# Patient Record
Sex: Female | Born: 1965 | Race: White | Hispanic: No | Marital: Married | Smoking: Light tobacco smoker
Health system: Southern US, Community
[De-identification: ages and names within clinical notes are randomized; demographics above are authoritative.]

## PROBLEM LIST (undated history)

## (undated) DIAGNOSIS — M1990 Unspecified osteoarthritis, unspecified site: Secondary | ICD-10-CM

## (undated) DIAGNOSIS — E119 Type 2 diabetes mellitus without complications: Secondary | ICD-10-CM

## (undated) DIAGNOSIS — G4700 Insomnia, unspecified: Secondary | ICD-10-CM

## (undated) DIAGNOSIS — E669 Obesity, unspecified: Secondary | ICD-10-CM

## (undated) DIAGNOSIS — E785 Hyperlipidemia, unspecified: Secondary | ICD-10-CM

## (undated) HISTORY — DX: Type 2 diabetes mellitus without complications: E11.9

## (undated) HISTORY — PX: HAND SURGERY: SHX662

## (undated) HISTORY — PX: TUBAL LIGATION: SHX77

## (undated) HISTORY — PX: APPENDECTOMY: SHX54

---

## 2001-09-12 HISTORY — PX: BACK SURGERY: SHX140

## 2002-09-14 HISTORY — PX: BACK SURGERY: SHX140

## 2004-05-26 ENCOUNTER — Observation Stay: Payer: Self-pay | Admitting: General Surgery

## 2004-08-28 ENCOUNTER — Ambulatory Visit: Payer: Self-pay

## 2004-09-08 ENCOUNTER — Ambulatory Visit: Payer: Self-pay

## 2005-03-10 ENCOUNTER — Ambulatory Visit: Payer: Self-pay

## 2006-04-08 ENCOUNTER — Ambulatory Visit: Payer: Self-pay

## 2007-03-24 DIAGNOSIS — E119 Type 2 diabetes mellitus without complications: Secondary | ICD-10-CM

## 2007-03-24 HISTORY — DX: Type 2 diabetes mellitus without complications: E11.9

## 2007-05-04 ENCOUNTER — Ambulatory Visit: Payer: Self-pay

## 2008-05-10 ENCOUNTER — Ambulatory Visit: Payer: Self-pay

## 2009-05-22 ENCOUNTER — Ambulatory Visit: Payer: Self-pay

## 2010-06-25 ENCOUNTER — Ambulatory Visit: Payer: Self-pay

## 2011-08-27 ENCOUNTER — Ambulatory Visit: Admit: 2011-08-27 | Disposition: A | Discharge status: Home / Self Care | Payer: Self-pay | Admitting: Internal Medicine

## 2013-08-03 ENCOUNTER — Encounter: Payer: Self-pay | Admitting: *Deleted

## 2013-08-30 ENCOUNTER — Ambulatory Visit: Payer: Self-pay | Admitting: General Surgery

## 2013-08-31 ENCOUNTER — Encounter: Payer: Self-pay | Admitting: *Deleted

## 2014-08-08 ENCOUNTER — Encounter: Payer: Self-pay | Admitting: *Deleted

## 2014-08-16 ENCOUNTER — Encounter: Payer: Self-pay | Admitting: General Surgery

## 2014-08-21 ENCOUNTER — Encounter: Payer: Self-pay | Admitting: General Surgery

## 2014-08-21 ENCOUNTER — Ambulatory Visit (INDEPENDENT_AMBULATORY_CARE_PROVIDER_SITE_OTHER): Payer: 59 | Admitting: General Surgery

## 2014-08-21 VITALS — BP 128/74 | HR 78 | Resp 12 | Ht 61.0 in | Wt 201.0 lb

## 2014-08-21 DIAGNOSIS — Z1211 Encounter for screening for malignant neoplasm of colon: Secondary | ICD-10-CM

## 2014-08-21 NOTE — Progress Notes (Signed)
Patient ID: Heather Valenzuela, female   DOB: Jan 13, 1966, 49 y.o.   MRN: [id number]  Chief Complaint  Patient presents with  . Other    screening colonoscopy    HPI Heather Valenzuela is a 49 y.o. femalehere today for a evaluation of a screening colonoscopy. Patient states no GI problems at this time. The patient reports she's lost significant amount of weight since started on oral hypoglycemic agents. She is aware of the increased/accelerated risk cardiovascular disease with ongoing smoking. HPI  Past Medical History  Diagnosis Date  . Diabetes mellitus without complication 9480    Type 2    Past Surgical History  Procedure Laterality Date  . Tubal ligation    . Appendectomy    . Hand surgery    . Back surgery      No family history on file.  Social History History  Substance Use Topics  . Smoking status: Current Every Day Smoker -- 1.00 packs/day    Types: Cigarettes  . Smokeless tobacco: Not on file  . Alcohol Use: 1.2 - 1.8 oz/week    2-3 Standard drinks or equivalent per week    No Known Allergies  Current Outpatient Prescriptions  Medication Sig Dispense Refill  . atorvastatin (LIPITOR) 40 MG tablet     . DULoxetine (CYMBALTA) 30 MG capsule     . INVOKANA 300 MG TABS tablet     . metFORMIN (GLUCOPHAGE-XR) 500 MG 24 hr tablet      No current facility-administered medications for this visit.    Review of Systems Review of Systems  Constitutional: Negative.   Respiratory: Negative.   Cardiovascular: Negative.   Gastrointestinal: Negative.     Blood pressure 128/74, pulse 78, resp. rate 12, height 5\' 1"  (1.549 m), weight 201 lb (91.173 kg).  Physical Exam Physical Exam  Constitutional: She is oriented to person, place, and time. She appears well-developed and well-nourished.  Eyes: Conjunctivae are normal. No scleral icterus.  Neck: Neck supple.  Cardiovascular: Normal rate and regular rhythm.   Murmur heard.  Systolic murmur is present with a grade of 2/6   Pulmonary/Chest: Effort normal and breath sounds normal.  Lymphadenopathy:    She has no cervical adenopathy.  Neurological: She is alert and oriented to person, place, and time.  Skin: Skin is warm and dry.      Assessment    Candidate for screening colonoscopy.    Plan    Colonoscopy with possible biopsy/polypectomy prn: Information regarding the procedure, including its potential risks and complications (including but not limited to perforation of the bowel, which may require emergency surgery to repair, and bleeding) was verbally given to the patient. Educational information regarding lower instestinal endoscopy was given to the patient. Written instructions for how to complete the bowel prep using Miralax were provided. The importance of drinking ample fluids to avoid dehydration as a result of the prep emphasized.  The patient is going to encourage her husband have a procedure completed at the same date. She would like to have her 70 year old daughter act as unified driver, and the daughter is off for con Friday. We'll see if we can't get a time in the future to complete this on the day of patient preference.      PCP:  Kandice Robinsons, Charanjit Ref. Dr. Wynona Luna, Forest Gleason 08/22/2014, 7:08 AM

## 2014-08-21 NOTE — Patient Instructions (Signed)
Colonoscopy  A colonoscopy is an exam to look at the entire large intestine (colon). This exam can help find problems such as tumors, polyps, inflammation, and areas of bleeding. The exam takes about 1 hour.   LET YOUR HEALTH CARE PROVIDER KNOW ABOUT:   · Any allergies you have.  · All medicines you are taking, including vitamins, herbs, eye drops, creams, and over-the-counter medicines.  · Previous problems you or members of your family have had with the use of anesthetics.  · Any blood disorders you have.  · Previous surgeries you have had.  · Medical conditions you have.  RISKS AND COMPLICATIONS   Generally, this is a safe procedure. However, as with any procedure, complications can occur. Possible complications include:  · Bleeding.  · Tearing or rupture of the colon wall.  · Reaction to medicines given during the exam.  · Infection (rare).  BEFORE THE PROCEDURE   · Ask your health care provider about changing or stopping your regular medicines.  · You may be prescribed an oral bowel prep. This involves drinking a large amount of medicated liquid, starting the day before your procedure. The liquid will cause you to have multiple loose stools until your stool is almost clear or light green. This cleans out your colon in preparation for the procedure.  · Do not eat or drink anything else once you have started the bowel prep, unless your health care provider tells you it is safe to do so.  · Arrange for someone to drive you home after the procedure.  PROCEDURE   · You will be given medicine to help you relax (sedative).  · You will lie on your side with your knees bent.  · A long, flexible tube with a light and camera on the end (colonoscope) will be inserted through the rectum and into the colon. The camera sends video back to a computer screen as it moves through the colon. The colonoscope also releases carbon dioxide gas to inflate the colon. This helps your health care provider see the area better.  · During  the exam, your health care provider may take a small tissue sample (biopsy) to be examined under a microscope if any abnormalities are found.  · The exam is finished when the entire colon has been viewed.  AFTER THE PROCEDURE   · Do not drive for 24 hours after the exam.  · You may have a small amount of blood in your stool.  · You may pass moderate amounts of gas and have mild abdominal cramping or bloating. This is caused by the gas used to inflate your colon during the exam.  · Ask when your test results will be ready and how you will get your results. Make sure you get your test results.  Document Released: 03/06/2000 Document Revised: 12/28/2012 Document Reviewed: 11/14/2012  ExitCare® Patient Information ©2015 [employer]. This information is not intended to replace advice given to you by your health care provider. Make sure you discuss any questions you have with your health care provider.

## 2014-08-22 DIAGNOSIS — Z1211 Encounter for screening for malignant neoplasm of colon: Secondary | ICD-10-CM | POA: Insufficient documentation

## 2014-09-05 ENCOUNTER — Telehealth: Payer: Self-pay | Admitting: *Deleted

## 2014-09-05 MED ORDER — POLYETHYLENE GLYCOL 3350 17 GM/SCOOP PO POWD: ORAL | Status: DC

## 2014-09-05 NOTE — Telephone Encounter (Signed)
Message left on home and work numbers for patient to call the office.  Patient has been scheduled for a colonoscopy at [facility] on 11-02-14.  Miralax prescription has been sent in to patient's pharmacy.

## 2014-09-10 ENCOUNTER — Encounter: Payer: Self-pay | Admitting: General Surgery

## 2014-09-10 ENCOUNTER — Telehealth: Payer: Self-pay | Admitting: *Deleted

## 2014-09-10 NOTE — Telephone Encounter (Signed)
Patient called back and was notified accordingly.   This patient was instructed to call back if she has further questions.

## 2014-10-30 ENCOUNTER — Other Ambulatory Visit: Payer: Self-pay | Admitting: General Surgery

## 2014-10-30 DIAGNOSIS — Z1211 Encounter for screening for malignant neoplasm of colon: Secondary | ICD-10-CM

## 2014-10-30 NOTE — H&P (Signed)
Heather Valenzuela is an 49 y.o. female.   Chief Complaint: Screening colonoscopy. HPI: Healthy 49 year old woman for screening colonoscopy. No GI symptoms.  No change in general health or medications since May 2016 office exam.  Past Medical History  Diagnosis Date  . Diabetes mellitus without complication 0045    Type 2    Past Surgical History  Procedure Laterality Date  . Tubal ligation    . Appendectomy    . Hand surgery    . Back surgery      No family history on file. Social History:  reports that she has been smoking Cigarettes.  She has been smoking about 1.00 pack per day. She does not have any smokeless tobacco history on file. She reports that she drinks about 1.2 - 1.8 oz of alcohol per week. She reports that she does not use illicit drugs.  Allergies: No Known Allergies   (Not in a hospital admission)  No results found for this or any previous visit (from the past 48 hour(s)). No results found.  Review of Systems  All other systems reviewed and are negative.   There were no vitals taken for this visit. Physical Exam  Constitutional: She appears well-developed and well-nourished.  HENT:  Head: Normocephalic.  Neck: Neck supple.  Cardiovascular: Normal rate and regular rhythm.   Respiratory: Effort normal and breath sounds normal.  GI: Soft.     Assessment/Plan Candidate for screening colonoscopy.  Robert Bellow 10/30/2014, 4:21 PM

## 2014-11-01 ENCOUNTER — Encounter: Payer: Self-pay | Admitting: *Deleted

## 2014-11-01 ENCOUNTER — Telehealth: Payer: Self-pay | Admitting: *Deleted

## 2014-11-01 NOTE — Telephone Encounter (Signed)
Message left on home and work numbers for patient to call the office.

## 2014-11-02 ENCOUNTER — Ambulatory Visit
Admission: RE | Admit: 2014-11-02 | Discharge: 2014-11-02 | Disposition: A | Discharge status: Home / Self Care | Payer: 59 | Source: Ambulatory Visit | Attending: General Surgery | Admitting: General Surgery

## 2014-11-02 ENCOUNTER — Encounter
Admission: RE | Disposition: A | Discharge status: Home / Self Care | Payer: Self-pay | Source: Ambulatory Visit | Attending: General Surgery

## 2014-11-02 ENCOUNTER — Encounter: Payer: Self-pay | Admitting: *Deleted

## 2014-11-02 ENCOUNTER — Ambulatory Visit: Payer: 59 | Admitting: Certified Registered Nurse Anesthetist

## 2014-11-02 DIAGNOSIS — Z9049 Acquired absence of other specified parts of digestive tract: Secondary | ICD-10-CM | POA: Diagnosis not present

## 2014-11-02 DIAGNOSIS — Z9889 Other specified postprocedural states: Secondary | ICD-10-CM | POA: Insufficient documentation

## 2014-11-02 DIAGNOSIS — Z79899 Other long term (current) drug therapy: Secondary | ICD-10-CM | POA: Insufficient documentation

## 2014-11-02 DIAGNOSIS — Z1211 Encounter for screening for malignant neoplasm of colon: Secondary | ICD-10-CM | POA: Diagnosis not present

## 2014-11-02 DIAGNOSIS — E119 Type 2 diabetes mellitus without complications: Secondary | ICD-10-CM | POA: Insufficient documentation

## 2014-11-02 DIAGNOSIS — K529 Noninfective gastroenteritis and colitis, unspecified: Secondary | ICD-10-CM | POA: Insufficient documentation

## 2014-11-02 DIAGNOSIS — Z9851 Tubal ligation status: Secondary | ICD-10-CM | POA: Diagnosis not present

## 2014-11-02 DIAGNOSIS — F17210 Nicotine dependence, cigarettes, uncomplicated: Secondary | ICD-10-CM | POA: Diagnosis not present

## 2014-11-02 HISTORY — PX: COLONOSCOPY: SHX5424

## 2014-11-02 LAB — GLUCOSE, CAPILLARY: GLUCOSE-CAPILLARY: 110 mg/dL — AB (ref 65–99)

## 2014-11-02 SURGERY — COLONOSCOPY
Anesthesia: General

## 2014-11-02 MED ORDER — LIDOCAINE HCL (CARDIAC) 20 MG/ML IV SOLN
INTRAVENOUS | Status: DC | PRN
  Administered 2014-11-02: 100 mg via INTRAVENOUS

## 2014-11-02 MED ORDER — PROPOFOL INFUSION 10 MG/ML OPTIME
INTRAVENOUS | Status: DC | PRN
  Administered 2014-11-02: 160 ug/kg/min via INTRAVENOUS

## 2014-11-02 MED ORDER — PROPOFOL 10 MG/ML IV BOLUS
INTRAVENOUS | Status: DC | PRN
  Administered 2014-11-02: 20 mg via INTRAVENOUS
  Administered 2014-11-02: 60 mg via INTRAVENOUS

## 2014-11-02 MED ORDER — SODIUM CHLORIDE 0.9 % IV SOLN
INTRAVENOUS | Status: DC
  Administered 2014-11-02: 1000 mL via INTRAVENOUS

## 2014-11-02 NOTE — Transfer of Care (Signed)
Immediate Anesthesia Transfer of Care Note  Patient: Heather Valenzuela  Procedure(s) Performed: Procedure(s): COLONOSCOPY (N/A)  Patient Location: PACU  Anesthesia Type:General  Level of Consciousness: awake, alert  and oriented  Airway & Oxygen Therapy: Patient Spontanous Breathing and Patient connected to nasal cannula oxygen  Post-op Assessment: Report given to RN and Post -op Vital signs reviewed and stable  Post vital signs: Reviewed and stable  Last Vitals:  Filed Vitals:   11/02/14 0840  BP: 131/72  Pulse: 67  Temp: 36.2 C  Resp: 0   Resp 18 Complications: No apparent anesthesia complications

## 2014-11-02 NOTE — H&P (Signed)
Heather Valenzuela is an 49 y.o. female.   Chief Complaint: Screening colonoscopy HPI: See previous history.   Past Medical History  Diagnosis Date  . Diabetes mellitus without complication 0160    Type 2    Past Surgical History  Procedure Laterality Date  . Tubal ligation    . Appendectomy    . Hand surgery    . Back surgery      History reviewed. No pertinent family history. Social History:  reports that she has been smoking Cigarettes.  She has been smoking about 1.00 pack per day. She does not have any smokeless tobacco history on file. She reports that she drinks about 1.2 - 1.8 oz of alcohol per week. She reports that she does not use illicit drugs.  Allergies: No Known Allergies  Medications Prior to Admission  Medication Sig Dispense Refill  . atorvastatin (LIPITOR) 40 MG tablet     . DULoxetine (CYMBALTA) 30 MG capsule     . INVOKANA 300 MG TABS tablet     . metFORMIN (GLUCOPHAGE-XR) 500 MG 24 hr tablet     . polyethylene glycol powder (GLYCOLAX/MIRALAX) powder 255 grams one bottle for colonoscopy prep 255 g 0    Results for orders placed or performed during the hospital encounter of 11/02/14 (from the past 48 hour(s))  Glucose, capillary     Status: Abnormal   Collection Time: 11/02/14  7:42 AM  Result Value Ref Range   Glucose-Capillary 110 (H) 65 - 99 mg/dL   No results found.  Review of Systems  All other systems reviewed and are negative.   Blood pressure 117/78, pulse 91, temperature 98.5 F (36.9 C), temperature source Tympanic, resp. rate 20, height 5\' 1"  (1.549 m), weight 200 lb (90.719 kg), SpO2 100 %. Physical Exam  Constitutional: She appears well-developed and well-nourished.  HENT:  Head: Normocephalic.  Eyes: Conjunctivae are normal.  Neck: Neck supple.  Cardiovascular: Normal rate and regular rhythm.   Respiratory: Effort normal and breath sounds normal.     Assessment/Plan Screening colonoscopy.  Robert Bellow 11/02/2014, 7:47  AM

## 2014-11-02 NOTE — Anesthesia Procedure Notes (Signed)
Performed by: Normajean Nash Pre-anesthesia Checklist: Patient identified, Emergency Drugs available, Suction available, Patient being monitored and Timeout performed Patient Re-evaluated:Patient Re-evaluated prior to inductionOxygen Delivery Method: Nasal cannula Intubation Type: IV induction       

## 2014-11-02 NOTE — Anesthesia Preprocedure Evaluation (Signed)
Anesthesia Evaluation  Patient identified by MRN, date of birth, ID band Patient awake    Reviewed: Allergy & Precautions, H&P , NPO status , Patient's Chart, lab work & pertinent test results, reviewed documented beta blocker date and time   History of Anesthesia Complications Negative for: history of anesthetic complications  Airway Mallampati: I  TM Distance: >3 FB Neck ROM: full    Dental no notable dental hx. (+) Teeth Intact   Pulmonary neg shortness of breath, neg sleep apnea, neg COPDneg recent URI, Current Smoker,  breath sounds clear to auscultation  Pulmonary exam normal       Cardiovascular Exercise Tolerance: Good negative cardio ROS Normal cardiovascular examRhythm:regular Rate:Normal     Neuro/Psych negative neurological ROS  negative psych ROS   GI/Hepatic negative GI ROS, Neg liver ROS,   Endo/Other  diabetes, Oral Hypoglycemic Agents  Renal/GU negative Renal ROS  negative genitourinary   Musculoskeletal   Abdominal   Peds  Hematology negative hematology ROS (+)   Anesthesia Other Findings Past Medical History:   Diabetes mellitus without complication          8127           Comment:Type 2   Reproductive/Obstetrics negative OB ROS                             Anesthesia Physical Anesthesia Plan  ASA: II  Anesthesia Plan: General   Post-op Pain Management:    Induction:   Airway Management Planned:   Additional Equipment:   Intra-op Plan:   Post-operative Plan:   Informed Consent: I have reviewed the patients History and Physical, chart, labs and discussed the procedure including the risks, benefits and alternatives for the proposed anesthesia with the patient or authorized representative who has indicated his/her understanding and acceptance.   Dental Advisory Given  Plan Discussed with: Anesthesiologist, CRNA and Surgeon  Anesthesia Plan Comments:          Anesthesia Quick Evaluation

## 2014-11-02 NOTE — Op Note (Signed)
[facility] Patient Name: Heather Valenzuela Procedure Date: 11/02/2014 7:18 AM MRN: [id number] Account #: [id number] Date of Birth: 02-Sep-1965 Admit Type: Outpatient Age: 49 Room: [facility] ENDO ROOM 1 Gender: Female Note Status: Finalized Procedure:         Colonoscopy Indications:       Screening for colorectal malignant neoplasm Providers:         Robert Bellow, MD Referring MD:      Shirline Frees (Referring MD) Medicines:         Monitored Anesthesia Care Complications:     No immediate complications. Procedure:         Pre-Anesthesia Assessment:                    - Prior to the procedure, a History and Physical was                     performed, and patient medications, allergies and                     sensitivities were reviewed. The patient's tolerance of                     previous anesthesia was reviewed.                    - The risks and benefits of the procedure and the sedation                     options and risks were discussed with the patient. All                     questions were answered and informed consent was obtained.                    After obtaining informed consent, the colonoscope was                     passed under direct vision. Throughout the procedure, the                     patient's blood pressure, pulse, and oxygen saturations                     were monitored continuously. The Colonoscope was                     introduced through the anus and advanced to the the cecum,                     identified by the appendiceal orifice, ileocecal valve and                     palpation. The colonoscopy was performed without                     difficulty. The patient tolerated the procedure well. The                     quality of the bowel preparation was excellent. Findings:      A diffuse pseudomembrane was found in the descending colon, at the       splenic flexure and in the transverse colon. This was  biopsied with a       cold forceps for  histology.      The retroflexed view of the distal rectum and anal verge was normal and       showed no anal or rectal abnormalities. Impression:        - Pseudomembranous enterocolitis.                    - The distal rectum and anal verge are normal on                     retroflexion view. Recommendation:    - Telephone endoscopist for pathology results in 1 week. Procedure Code(s): --- Professional ---                    [phone], Colonoscopy, flexible; with biopsy, single or                     multiple Diagnosis Code(s): --- Professional ---                    Z12.11, Encounter for screening for malignant neoplasm of                     colon CPT copyright 2014 American Medical Association. All rights reserved. The codes documented in this report are preliminary and upon coder review may  be revised to meet current compliance requirements. Robert Bellow, MD 11/02/2014 8:38:06 AM This report has been signed electronically. Number of Addenda: 0 Note Initiated On: 11/02/2014 7:18 AM Scope Withdrawal Time: 0 hours 13 minutes 46 seconds  Total Procedure Duration: 0 hours 20 minutes 40 seconds       [facility]

## 2014-11-04 NOTE — Anesthesia Postprocedure Evaluation (Signed)
  Anesthesia Post-op Note  Patient: Heather Valenzuela  Procedure(s) Performed: Procedure(s): COLONOSCOPY (N/A)  Anesthesia type:General  Patient location: PACU  Post pain: Pain level controlled  Post assessment: Post-op Vital signs reviewed, Patient's Cardiovascular Status Stable, Respiratory Function Stable, Patent Airway and No signs of Nausea or vomiting  Post vital signs: Reviewed and stable  Last Vitals:  Filed Vitals:   11/02/14 0913  BP: 132/71  Pulse: 60  Temp:   Resp: 13    Level of consciousness: awake, alert  and patient cooperative  Complications: No apparent anesthesia complications

## 2014-11-05 LAB — SURGICAL PATHOLOGY

## 2014-11-06 ENCOUNTER — Telehealth: Payer: Self-pay | Admitting: General Surgery

## 2014-11-06 NOTE — Telephone Encounter (Signed)
The patient was asked to call back for details of her recently completed colonoscopy biopsy.

## 2014-11-07 ENCOUNTER — Telehealth: Payer: Self-pay | Admitting: *Deleted

## 2014-11-07 ENCOUNTER — Other Ambulatory Visit: Payer: Self-pay | Admitting: *Deleted

## 2014-11-07 ENCOUNTER — Other Ambulatory Visit: Payer: Self-pay | Admitting: General Surgery

## 2014-11-07 DIAGNOSIS — K529 Noninfective gastroenteritis and colitis, unspecified: Secondary | ICD-10-CM

## 2014-11-07 NOTE — Telephone Encounter (Signed)
-----  Message from Robert Bellow, MD sent at 11/07/2014  9:39 AM EDT ----- Please notify the patient no cancer or polyps on recent colonoscopy. Some inflammation in the colon (little patches like grains of rice). Would recommend she have a CBC,ESR and CRP to assess.  ----- Message -----    From: [facility]    Sent: 11/05/2014  12:44 PM      To: Robert Bellow, MD

## 2014-11-07 NOTE — Telephone Encounter (Signed)
Notified patient as instructed, patient pleased. Faxed lab form to pt so she could get labs drawn. Discussed follow-up appointments, patient agrees

## 2014-11-07 NOTE — Telephone Encounter (Signed)
Patient is calling you back in regards to the message you left with her yesterday about colonoscopy bx results

## 2014-11-08 LAB — CBC WITH DIFFERENTIAL/PLATELET
BASOS: 0 %
Basophils Absolute: 0 10*3/uL (ref 0.0–0.2)
EOS (ABSOLUTE): 0.2 10*3/uL (ref 0.0–0.4)
Eos: 3 %
HEMATOCRIT: 39.4 % (ref 34.0–46.6)
HEMOGLOBIN: 13.6 g/dL (ref 11.1–15.9)
IMMATURE GRANS (ABS): 0 10*3/uL (ref 0.0–0.1)
Immature Granulocytes: 0 %
Lymphocytes Absolute: 3.3 10*3/uL — ABNORMAL HIGH (ref 0.7–3.1)
Lymphs: 40 %
MCH: 28 pg (ref 26.6–33.0)
MCHC: 34.5 g/dL (ref 31.5–35.7)
MCV: 81 fL (ref 79–97)
Monocytes Absolute: 0.6 10*3/uL (ref 0.1–0.9)
Monocytes: 7 %
NEUTROS PCT: 50 %
Neutrophils Absolute: 4.1 10*3/uL (ref 1.4–7.0)
Platelets: 235 10*3/uL (ref 150–379)
RBC: 4.85 x10E6/uL (ref 3.77–5.28)
RDW: 13.6 % (ref 12.3–15.4)
WBC: 8.2 10*3/uL (ref 3.4–10.8)

## 2014-11-08 LAB — SEDIMENTATION RATE: Sed Rate: 24 mm/hr (ref 0–40)

## 2014-11-08 LAB — HIGH SENSITIVITY CRP: CRP, High Sensitivity: 2.41 mg/L (ref 0.00–3.00)

## 2014-11-11 NOTE — Telephone Encounter (Signed)
Message left with the patient's daughter, Heather Valenzuela as the patient was out of town. All laboratory studies normal.  No further intervention regarding the focal areas of colitis on recent colonoscopy.  Patient is welcome to return call for details if desired.

## 2014-11-19 ENCOUNTER — Encounter: Payer: Self-pay | Admitting: General Surgery

## 2014-11-28 ENCOUNTER — Encounter: Payer: Self-pay | Admitting: General Surgery

## 2016-10-21 ENCOUNTER — Other Ambulatory Visit: Payer: Self-pay | Admitting: Family Medicine

## 2016-10-21 DIAGNOSIS — Z1231 Encounter for screening mammogram for malignant neoplasm of breast: Secondary | ICD-10-CM

## 2016-10-23 ENCOUNTER — Ambulatory Visit
Admission: RE | Admit: 2016-10-23 | Discharge: 2016-10-23 | Disposition: A | Discharge status: Home / Self Care | Payer: 59 | Source: Ambulatory Visit | Attending: Family Medicine | Admitting: Family Medicine

## 2016-10-23 DIAGNOSIS — Z1231 Encounter for screening mammogram for malignant neoplasm of breast: Secondary | ICD-10-CM | POA: Diagnosis not present

## 2016-10-30 ENCOUNTER — Ambulatory Visit
Admission: RE | Admit: 2016-10-30 | Discharge: 2016-10-30 | Disposition: A | Discharge status: Home / Self Care | Payer: 59 | Source: Ambulatory Visit | Attending: Gastroenterology | Admitting: Gastroenterology

## 2016-10-30 ENCOUNTER — Other Ambulatory Visit: Payer: Self-pay | Admitting: Gastroenterology

## 2016-10-30 DIAGNOSIS — R140 Abdominal distension (gaseous): Secondary | ICD-10-CM

## 2016-10-30 DIAGNOSIS — R1084 Generalized abdominal pain: Secondary | ICD-10-CM | POA: Insufficient documentation

## 2016-11-16 ENCOUNTER — Encounter
Admission: RE | Disposition: A | Discharge status: Home / Self Care | Payer: Self-pay | Source: Ambulatory Visit | Attending: Gastroenterology

## 2016-11-16 ENCOUNTER — Ambulatory Visit: Payer: 59 | Admitting: Anesthesiology

## 2016-11-16 ENCOUNTER — Encounter: Payer: Self-pay | Admitting: Anesthesiology

## 2016-11-16 ENCOUNTER — Ambulatory Visit
Admission: RE | Admit: 2016-11-16 | Discharge: 2016-11-16 | Disposition: A | Discharge status: Home / Self Care | Payer: 59 | Source: Ambulatory Visit | Attending: Gastroenterology | Admitting: Gastroenterology

## 2016-11-16 DIAGNOSIS — E119 Type 2 diabetes mellitus without complications: Secondary | ICD-10-CM | POA: Diagnosis not present

## 2016-11-16 DIAGNOSIS — G4700 Insomnia, unspecified: Secondary | ICD-10-CM | POA: Diagnosis not present

## 2016-11-16 DIAGNOSIS — Z6839 Body mass index (BMI) 39.0-39.9, adult: Secondary | ICD-10-CM | POA: Diagnosis not present

## 2016-11-16 DIAGNOSIS — R194 Change in bowel habit: Secondary | ICD-10-CM | POA: Insufficient documentation

## 2016-11-16 DIAGNOSIS — E785 Hyperlipidemia, unspecified: Secondary | ICD-10-CM | POA: Insufficient documentation

## 2016-11-16 DIAGNOSIS — K5730 Diverticulosis of large intestine without perforation or abscess without bleeding: Secondary | ICD-10-CM | POA: Diagnosis not present

## 2016-11-16 DIAGNOSIS — E669 Obesity, unspecified: Secondary | ICD-10-CM | POA: Insufficient documentation

## 2016-11-16 DIAGNOSIS — Z7984 Long term (current) use of oral hypoglycemic drugs: Secondary | ICD-10-CM | POA: Diagnosis not present

## 2016-11-16 DIAGNOSIS — M1990 Unspecified osteoarthritis, unspecified site: Secondary | ICD-10-CM | POA: Insufficient documentation

## 2016-11-16 DIAGNOSIS — D122 Benign neoplasm of ascending colon: Secondary | ICD-10-CM | POA: Diagnosis not present

## 2016-11-16 DIAGNOSIS — Z79899 Other long term (current) drug therapy: Secondary | ICD-10-CM | POA: Diagnosis not present

## 2016-11-16 HISTORY — DX: Insomnia, unspecified: G47.00

## 2016-11-16 HISTORY — DX: Obesity, unspecified: E66.9

## 2016-11-16 HISTORY — DX: Unspecified osteoarthritis, unspecified site: M19.90

## 2016-11-16 HISTORY — DX: Hyperlipidemia, unspecified: E78.5

## 2016-11-16 HISTORY — PX: COLONOSCOPY WITH PROPOFOL: SHX5780

## 2016-11-16 LAB — GLUCOSE, CAPILLARY: Glucose-Capillary: 118 mg/dL — ABNORMAL HIGH (ref 65–99)

## 2016-11-16 SURGERY — COLONOSCOPY WITH PROPOFOL
Anesthesia: General

## 2016-11-16 MED ORDER — PROPOFOL 500 MG/50ML IV EMUL
INTRAVENOUS | Status: DC | PRN
  Administered 2016-11-16: 150 ug/kg/min via INTRAVENOUS

## 2016-11-16 MED ORDER — SODIUM CHLORIDE 0.9 % IV SOLN
INTRAVENOUS | Status: DC
  Administered 2016-11-16: 1000 mL via INTRAVENOUS
  Administered 2016-11-16: 09:00:00 via INTRAVENOUS

## 2016-11-16 MED ORDER — SODIUM CHLORIDE 0.9 % IV SOLN: INTRAVENOUS | Status: DC

## 2016-11-16 MED ORDER — FENTANYL CITRATE (PF) 100 MCG/2ML IJ SOLN: 25.0000 ug | INTRAMUSCULAR | Status: DC | PRN

## 2016-11-16 MED ORDER — PROPOFOL 10 MG/ML IV BOLUS
INTRAVENOUS | Status: DC | PRN
  Administered 2016-11-16: 3 mg via INTRAVENOUS
  Administered 2016-11-16: 60 mg via INTRAVENOUS

## 2016-11-16 MED ORDER — ONDANSETRON HCL 4 MG/2ML IJ SOLN: 4.0000 mg | Freq: Once | INTRAMUSCULAR | Status: DC | PRN

## 2016-11-16 MED ORDER — PROPOFOL 500 MG/50ML IV EMUL
INTRAVENOUS | Status: AC
Start: 2016-11-16 — End: 2016-11-16
  Filled 2016-11-16: qty 50

## 2016-11-16 MED ORDER — PROPOFOL 10 MG/ML IV BOLUS
INTRAVENOUS | Status: AC
  Filled 2016-11-16: qty 20

## 2016-11-16 NOTE — Anesthesia Post-op Follow-up Note (Signed)
Anesthesia QCDR form completed.        

## 2016-11-16 NOTE — Transfer of Care (Signed)
Immediate Anesthesia Transfer of Care Note  Patient: Heather Valenzuela  Procedure(s) Performed: Procedure(s): COLONOSCOPY WITH PROPOFOL (N/A)  Patient Location: PACU  Anesthesia Type:General  Level of Consciousness: awake, alert  and oriented  Airway & Oxygen Therapy: Patient Spontanous Breathing and Patient connected to nasal cannula oxygen  Post-op Assessment: Report given to RN and Post -op Vital signs reviewed and stable  Post vital signs: Reviewed and stable  Last Vitals:  Vitals:   11/16/16 0751  BP: 138/75  Pulse: 84  Resp: 16  Temp: 36.9 C  SpO2: 100%    Last Pain:  Vitals:   11/16/16 0751  TempSrc: Tympanic         Complications: No apparent anesthesia complications

## 2016-11-16 NOTE — H&P (Signed)
Outpatient short stay form Pre-procedure 11/16/2016 8:54 AM Lollie Sails MD  Primary Physician: Mercy Riding NP  Reason for visit:  Colonoscopy  History of present illness:  Patient is a 51 year old female presenting today for colonoscopy in regards to change of bowel habits. Her bowel habits are quite variable. Many times they are loose however she is also had some increased fecal incontinence. She does have a history of episiotomy on the delivery of a single child. This was over 20 years ago. She has tried taking Bentyl without good effect. She tolerated her prep well. She takes no aspirin or blood thinning agents. It is of note the patient does take Cymbalta, metformin, and trulicity.    Current Facility-Administered Medications:  .  0.9 %  sodium chloride infusion, , Intravenous, Continuous, Lollie Sails, MD, Last Rate: 20 mL/hr at 11/16/16 0803, 1,000 mL at 11/16/16 0803 .  0.9 %  sodium chloride infusion, , Intravenous, Continuous, Lollie Sails, MD  Prescriptions Prior to Admission  Medication Sig Dispense Refill Last Dose  . ALPRAZolam (XANAX) 0.25 MG tablet Take 0.25 mg by mouth at bedtime as needed for sleep.   Past Week at Unknown time  . atorvastatin (LIPITOR) 40 MG tablet    Past Week at Unknown time  . DULoxetine (CYMBALTA) 30 MG capsule    11/15/2016 at Unknown time  . empagliflozin (JARDIANCE) 10 MG TABS tablet Take 10 mg by mouth daily before breakfast.   11/15/2016 at Unknown time  . metFORMIN (GLUCOPHAGE-XR) 500 MG 24 hr tablet    11/15/2016 at Unknown time  . Dulaglutide (TRULICITY) 1.5 OY/7.7AJ SOPN Inject 1.5 mg into the skin every 7 (seven) days.   Completed Course at Unknown time  . fluticasone (FLONASE) 50 MCG/ACT nasal spray Place into both nostrils daily.   Completed Course at Unknown time  . INVOKANA 300 MG TABS tablet    Completed Course at Unknown time     No Known Allergies   Past Medical History:  Diagnosis Date  . Diabetes mellitus without  complication ([place]) 2878   Type 2  . Dyslipidemia   . Insomnia   . Obesity   . Osteoarthrosis    unspecified whether generalized or localized, lower leg     Review of systems:      Physical Exam    Heart and lungs: Regular rate and rhythm without rub or gallop, lungs are bilaterally clear.    HEENT: Normocephalic atraumatic eyes are anicteric    Other:     Pertinant exam for procedure: Soft nontender nondistended bowel sounds positive normoactive    Planned proceedures: Colonoscopy and indicated procedures. I have discussed the risks benefits and complications of procedures to include not limited to bleeding, infection, perforation and the risk of sedation and the patient wishes to proceed.    Lollie Sails, MD Gastroenterology 11/16/2016  8:54 AM

## 2016-11-16 NOTE — Anesthesia Preprocedure Evaluation (Signed)
Anesthesia Evaluation  Patient identified by MRN, date of birth, ID band Patient awake    Reviewed: Allergy & Precautions, H&P , NPO status , Patient's Chart, lab work & pertinent test results, reviewed documented beta blocker date and time   History of Anesthesia Complications Negative for: history of anesthetic complications  Airway Mallampati: I  TM Distance: >3 FB Neck ROM: full    Dental no notable dental hx. (+) Teeth Intact   Pulmonary neg shortness of breath, neg sleep apnea, neg COPD, neg recent URI, Current Smoker,    Pulmonary exam normal breath sounds clear to auscultation       Cardiovascular Exercise Tolerance: Good negative cardio ROS Normal cardiovascular exam Rhythm:regular Rate:Normal     Neuro/Psych negative neurological ROS  negative psych ROS   GI/Hepatic negative GI ROS, Neg liver ROS,   Endo/Other  diabetes, Well Controlled, Oral Hypoglycemic Agents  Renal/GU negative Renal ROS  negative genitourinary   Musculoskeletal  (+) Arthritis , Osteoarthritis,    Abdominal   Peds  Hematology negative hematology ROS (+)   Anesthesia Other Findings Past Medical History:   Diabetes mellitus without complication          2725           Comment:Type 2   Reproductive/Obstetrics negative OB ROS                             Anesthesia Physical  Anesthesia Plan  ASA: II  Anesthesia Plan: General   Post-op Pain Management:    Induction:   PONV Risk Score and Plan:   Airway Management Planned:   Additional Equipment:   Intra-op Plan:   Post-operative Plan:   Informed Consent: I have reviewed the patients History and Physical, chart, labs and discussed the procedure including the risks, benefits and alternatives for the proposed anesthesia with the patient or authorized representative who has indicated his/her understanding and acceptance.   Dental Advisory  Given  Plan Discussed with: Anesthesiologist, CRNA and Surgeon  Anesthesia Plan Comments:         Anesthesia Quick Evaluation

## 2016-11-16 NOTE — Anesthesia Postprocedure Evaluation (Signed)
Anesthesia Post Note  Patient: Heather Valenzuela  Procedure(s) Performed: Procedure(s) (LRB): COLONOSCOPY WITH PROPOFOL (N/A)  Patient location during evaluation: PACU Anesthesia Type: General Level of consciousness: awake and alert and oriented Pain management: pain level controlled Vital Signs Assessment: post-procedure vital signs reviewed and stable Respiratory status: spontaneous breathing Cardiovascular status: blood pressure returned to baseline Anesthetic complications: no     Last Vitals:  Vitals:   11/16/16 1007 11/16/16 1017  BP: 127/80 126/78  Pulse: 72 66  Resp: 16 18  Temp:    SpO2: 97% 100%    Last Pain:  Vitals:   11/16/16 0947  TempSrc: Tympanic                 Quanasia Defino

## 2016-11-16 NOTE — Op Note (Signed)
[facility] Patient Name: Heather Valenzuela Procedure Date: 11/16/2016 8:50 AM MRN: [id number] Account #: [id number] Date of Birth: 02/11/1966 Admit Type: Outpatient Age: 51 Room: [facility] ENDO ROOM 3 Gender: Female Note Status: Finalized Procedure:            Colonoscopy Indications:          Follow-up of ulcerative colitis, Change in bowel habits Providers:            Lollie Sails, MD Referring MD:         No Local Md, MD (Referring MD) Medicines:            Monitored Anesthesia Care Complications:        No immediate complications. Procedure:            Pre-Anesthesia Assessment:                       - ASA Grade Assessment: II - A patient with mild                        systemic disease.                       After obtaining informed consent, the colonoscope was                        passed under direct vision. Throughout the procedure,                        the patient's blood pressure, pulse, and oxygen                        saturations were monitored continuously. The Olympus                        PCF-H180AL colonoscope ( S#: [id number] ) was introduced                        through the anus and advanced to the the cecum,                        identified by appendiceal orifice and ileocecal valve.                        The colonoscopy was unusually difficult due to                        significant looping. Successful completion of the                        procedure was aided by changing the patient to a supine                        position, changing the patient to a prone position and                        using manual pressure. The patient tolerated the                        procedure well. The quality of the bowel preparation  was good. Findings:      Multiple small-mouthed diverticula were found in the sigmoid colon,       descending colon, transverse colon and ascending colon.      A 5 mm polyp was  found in the mid ascending colon. The polyp was       sessile. The polyp was removed with a cold snare. Resection and       retrieval were complete.      Biopsies for histology were taken with a cold forceps from the right       colon and left colon for evaluation of microscopic colitis.      The retroflexed view of the distal rectum and anal verge was normal and       showed no anal or rectal abnormalities.      Rectal examination prior to sedation showed weak external sphincter,       normal descent. Impression:           - Diverticulosis in the sigmoid colon, in the                        descending colon, in the transverse colon and in the                        ascending colon.                       - One 5 mm polyp in the mid ascending colon, removed                        with a cold snare. Resected and retrieved.                       - The distal rectum and anal verge are normal on                        retroflexion view.                       - Biopsies were taken with a cold forceps from the                        right colon and left colon for evaluation of                        microscopic colitis. Recommendation:       - Immodium 2 mg po daily, one dose of citrucel daily,                        [place]agle exercises.                       - Return to GI clinic in 4 weeks. Procedure Code(s):    --- Professional ---                       [phone], Colonoscopy, flexible; with removal of tumor(s),                        polyp(s), or other lesion(s) by snare technique  82423, 4, Colonoscopy, flexible; with biopsy, single                        or multiple Diagnosis Code(s):    --- Professional ---                       D12.2, Benign neoplasm of ascending colon                       K51.90, Ulcerative colitis, unspecified, without                        complications                       R19.4, Change in bowel habit                       K57.30, Diverticulosis  of large intestine without                        perforation or abscess without bleeding CPT copyright 2016 American Medical Association. All rights reserved. The codes documented in this report are preliminary and upon coder review may  be revised to meet current compliance requirements. Lollie Sails, MD 11/16/2016 10:02:53 AM This report has been signed electronically. Number of Addenda: 0 Note Initiated On: 11/16/2016 8:50 AM Scope Withdrawal Time: 0 hours 18 minutes 24 seconds  Total Procedure Duration: 0 hours 36 minutes 13 seconds       [facility]

## 2016-11-17 ENCOUNTER — Encounter: Payer: Self-pay | Admitting: Gastroenterology

## 2016-11-17 LAB — SURGICAL PATHOLOGY

## 2016-12-03 ENCOUNTER — Encounter: Payer: Self-pay | Admitting: Obstetrics and Gynecology

## 2016-12-03 ENCOUNTER — Ambulatory Visit (INDEPENDENT_AMBULATORY_CARE_PROVIDER_SITE_OTHER): Payer: 59 | Admitting: Obstetrics and Gynecology

## 2016-12-03 VITALS — BP 110/72 | HR 84 | Ht 61.0 in | Wt 206.0 lb

## 2016-12-03 DIAGNOSIS — Z1231 Encounter for screening mammogram for malignant neoplasm of breast: Secondary | ICD-10-CM | POA: Diagnosis not present

## 2016-12-03 DIAGNOSIS — Z01419 Encounter for gynecological examination (general) (routine) without abnormal findings: Secondary | ICD-10-CM | POA: Diagnosis not present

## 2016-12-03 DIAGNOSIS — Z1239 Encounter for other screening for malignant neoplasm of breast: Secondary | ICD-10-CM

## 2016-12-03 NOTE — Progress Notes (Signed)
PCP: Alanson Aly, FNP   Chief Complaint  Patient presents with  . Gynecologic Exam    HPI:      [name] is a 51 y.o. G1P0 who LMP was No LMP recorded. Patient is postmenopausal., presents today for her annual examination.  Her menses are absent due to menopause. She does not have intermenstrual bleeding.  She does not have vasomotor sx.  Sex activity: single partner, contraception - post menopausal status. She does not have vaginal dryness.  Last Pap: Aug 07, 2014  Results were: no abnormalities /neg HPV DNA.  Hx of STDs: none  Last mammogram: October 23, 2016  Results were: normal--routine follow-up in 12 months There is no FH of breast cancer. There is no FH of ovarian cancer. The patient does do self-breast exams.  Colonoscopy: colonoscopy 2 years ago and this yr due to GI sx,  without abnormalities. . Repeat due after 10 years.   Tobacco use: occasionally Alcohol use: social drinker Exercise: not active  She does get adequate calcium and Vitamin D in her diet.  Labs with PCP.   Past Medical History:  Diagnosis Date  . Diabetes mellitus without complication ([place]) 5053   Type 2  . Dyslipidemia   . Insomnia   . Obesity   . Osteoarthrosis    unspecified whether generalized or localized, lower leg     Past Surgical History:  Procedure Laterality Date  . APPENDECTOMY    . BACK SURGERY  09/14/2002   L5-S1 hemilaminectomy and diskectomy   . BACK SURGERY  09/12/2001   L5-S1 microdiskectomy   . COLONOSCOPY N/A 11/02/2014   Procedure: COLONOSCOPY;  Surgeon: Robert Bellow, MD;  Location: [facility] ENDOSCOPY;  Service: Endoscopy;  Laterality: N/A;  . COLONOSCOPY WITH PROPOFOL N/A 11/16/2016   Procedure: COLONOSCOPY WITH PROPOFOL;  Surgeon: Lollie Sails, MD;  Location: [facility] ENDOSCOPY;  Service: Endoscopy;  Laterality: N/A;  . HAND SURGERY    . TUBAL LIGATION      Family History  Problem Relation Age of Onset  . Osteoporosis Mother   . Heart block  Father   . Heart attack Father   . Diabetes Brother   . Breast cancer Neg Hx     Social History   Social History  . Marital status: Married    Spouse name: N/A  . Number of children: N/A  . Years of education: N/A   Occupational History  . Not on file.   Social History Main Topics  . Smoking status: Light Tobacco Smoker    Packs/day: 0.50    Types: Cigarettes  . Smokeless tobacco: Never Used  . Alcohol use 1.2 - 1.8 oz/week    2 - 3 Standard drinks or equivalent per week     Comment: socially  . Drug use: No  . Sexual activity: Yes    Partners: Male    Birth control/ protection: Post-menopausal   Other Topics Concern  . Not on file   Social History Narrative  . No narrative on file    Current Meds  Medication Sig  . ALPRAZolam (XANAX) 0.25 MG tablet Take 0.25 mg by mouth at bedtime as needed for sleep.  Marland Kitchen atorvastatin (LIPITOR) 40 MG tablet   . Blood Glucose Monitoring Suppl (FIFTY50 GLUCOSE METER 2.0) w/Device KIT Use as directed.  . Continuous Blood Gluc Sensor (FREESTYLE LIBRE SENSOR SYSTEM) MISC Use 3 each every 10 (ten) days.  . DULoxetine (CYMBALTA) 30 MG capsule   . empagliflozin (JARDIANCE)  10 MG TABS tablet Take 10 mg by mouth daily before breakfast.  . metFORMIN (GLUCOPHAGE-XR) 500 MG 24 hr tablet       ROS:  Review of Systems  Constitutional: Negative for fatigue, fever and unexpected weight change.  Respiratory: Negative for cough, shortness of breath and wheezing.   Cardiovascular: Negative for chest pain, palpitations and leg swelling.  Gastrointestinal: Negative for blood in stool, constipation, diarrhea, nausea and vomiting.  Endocrine: Negative for cold intolerance, heat intolerance and polyuria.  Genitourinary: Negative for dyspareunia, dysuria, flank pain, frequency, genital sores, hematuria, menstrual problem, pelvic pain, urgency, vaginal bleeding, vaginal discharge and vaginal pain.  Musculoskeletal: Negative for back pain, joint  swelling and myalgias.  Skin: Negative for rash.  Neurological: Negative for dizziness, syncope, light-headedness, numbness and headaches.  Hematological: Negative for adenopathy.  Psychiatric/Behavioral: Negative for agitation, confusion, sleep disturbance and suicidal ideas. The patient is not nervous/anxious.      Objective: BP 110/72 (BP Location: Left Arm, Patient Position: Sitting, Cuff Size: Normal)   Pulse 84   Ht '5\' 1"'$  (1.549 m)   Wt 206 lb (93.4 kg)   BMI 38.92 kg/m    Physical Exam  Constitutional: She is oriented to person, place, and time. She appears well-developed and well-nourished.  Genitourinary: Vagina normal and uterus normal. There is no rash or tenderness on the right labia. There is no rash or tenderness on the left labia. No erythema or tenderness in the vagina. No vaginal discharge found. Right adnexum does not display mass and does not display tenderness. Left adnexum does not display mass and does not display tenderness. Cervix does not exhibit motion tenderness or polyp. Uterus is not enlarged or tender.  Neck: Normal range of motion. No thyromegaly present.  Cardiovascular: Normal rate, regular rhythm and normal heart sounds.   No murmur heard. Pulmonary/Chest: Effort normal and breath sounds normal. Right breast exhibits no mass, no nipple discharge, no skin change and no tenderness. Left breast exhibits no mass, no nipple discharge, no skin change and no tenderness.  Abdominal: Soft. There is no tenderness. There is no guarding.  Musculoskeletal: Normal range of motion.  Neurological: She is alert and oriented to person, place, and time. No cranial nerve deficit.  Psychiatric: She has a normal mood and affect. Her behavior is normal.  Vitals reviewed.   Assessment/Plan:  Encounter for annual routine gynecological examination  Screening for breast cancer - Pt is current on mammo.          GYN counsel mammography screening, menopause, adequate intake  of calcium and vitamin D, diet and exercise    F/U  Return in about 1 year (around 12/03/2017).  Kyandra Mcclaine B. Alivya Wegman, PA-C 12/03/2016 8:58 AM

## 2017-06-09 ENCOUNTER — Other Ambulatory Visit: Payer: Self-pay | Admitting: Family Medicine

## 2017-06-09 ENCOUNTER — Other Ambulatory Visit: Payer: Self-pay | Admitting: Nurse Practitioner

## 2017-06-09 DIAGNOSIS — Z1231 Encounter for screening mammogram for malignant neoplasm of breast: Secondary | ICD-10-CM

## 2017-09-30 ENCOUNTER — Other Ambulatory Visit: Payer: Self-pay | Admitting: Nurse Practitioner

## 2017-09-30 DIAGNOSIS — N281 Cyst of kidney, acquired: Secondary | ICD-10-CM

## 2017-10-01 ENCOUNTER — Other Ambulatory Visit: Payer: Self-pay | Admitting: Physical Medicine and Rehabilitation

## 2017-10-01 DIAGNOSIS — N281 Cyst of kidney, acquired: Secondary | ICD-10-CM

## 2017-10-04 ENCOUNTER — Ambulatory Visit: Payer: 59

## 2017-10-06 ENCOUNTER — Ambulatory Visit
Admission: RE | Admit: 2017-10-06 | Discharge: 2017-10-06 | Disposition: A | Discharge status: Home / Self Care | Payer: Managed Care, Other (non HMO) | Source: Ambulatory Visit | Attending: Nurse Practitioner | Admitting: Nurse Practitioner

## 2017-10-06 DIAGNOSIS — N289 Disorder of kidney and ureter, unspecified: Secondary | ICD-10-CM | POA: Diagnosis present

## 2017-10-06 DIAGNOSIS — N281 Cyst of kidney, acquired: Secondary | ICD-10-CM | POA: Diagnosis not present

## 2017-10-26 ENCOUNTER — Ambulatory Visit
Admission: RE | Admit: 2017-10-26 | Discharge: 2017-10-26 | Disposition: A | Discharge status: Home / Self Care | Payer: Managed Care, Other (non HMO) | Source: Ambulatory Visit | Attending: Nurse Practitioner | Admitting: Nurse Practitioner

## 2017-10-26 DIAGNOSIS — Z1231 Encounter for screening mammogram for malignant neoplasm of breast: Secondary | ICD-10-CM | POA: Diagnosis present

## 2018-02-22 ENCOUNTER — Ambulatory Visit (INDEPENDENT_AMBULATORY_CARE_PROVIDER_SITE_OTHER): Payer: Managed Care, Other (non HMO) | Admitting: Obstetrics and Gynecology

## 2018-02-22 ENCOUNTER — Encounter: Payer: Self-pay | Admitting: Obstetrics and Gynecology

## 2018-02-22 ENCOUNTER — Other Ambulatory Visit (HOSPITAL_COMMUNITY)
Admission: RE | Admit: 2018-02-22 | Discharge: 2018-02-22 | Disposition: A | Discharge status: Home / Self Care | Payer: Managed Care, Other (non HMO) | Source: Ambulatory Visit | Attending: Obstetrics and Gynecology | Admitting: Obstetrics and Gynecology

## 2018-02-22 VITALS — BP 126/82 | HR 69 | Ht 61.0 in | Wt 190.0 lb

## 2018-02-22 DIAGNOSIS — Z124 Encounter for screening for malignant neoplasm of cervix: Secondary | ICD-10-CM

## 2018-02-22 DIAGNOSIS — Z1151 Encounter for screening for human papillomavirus (HPV): Secondary | ICD-10-CM | POA: Diagnosis present

## 2018-02-22 DIAGNOSIS — Z01419 Encounter for gynecological examination (general) (routine) without abnormal findings: Secondary | ICD-10-CM

## 2018-02-22 DIAGNOSIS — Z1239 Encounter for other screening for malignant neoplasm of breast: Secondary | ICD-10-CM

## 2018-02-22 NOTE — Progress Notes (Signed)
PCP: Sallee Lange, NP   Chief Complaint  Patient presents with  . Gynecologic Exam    HPI:      [name] is a 52 y.o. G1P0 who LMP was No LMP recorded. Patient is postmenopausal., presents today for her annual examination.  Her menses are absent due to menopause. She does not have intermenstrual bleeding.  She does not have vasomotor sx.  Sex activity: single partner, contraception - post menopausal status. She does not have vaginal dryness.  Last Pap: Aug 07, 2014  Results were: no abnormalities /neg HPV DNA.  Hx of STDs: none  Last mammogram: 10/26/17 Results were: normal--routine follow-up in 12 months There is no FH of breast cancer. There is no FH of ovarian cancer. The patient does do self-breast exams.  Colonoscopy: colonoscopy 1 year ago  without abnormalities. . Repeat due after 10 years.   Tobacco use: occasionally Alcohol use: social drinker Exercise: not active  She does get adequate calcium and Vitamin D in her diet.  Labs with PCP.   Past Medical History:  Diagnosis Date  . Diabetes mellitus without complication ([place]) 6962   Type 2  . Dyslipidemia   . Insomnia   . Obesity   . Osteoarthrosis    unspecified whether generalized or localized, lower leg     Past Surgical History:  Procedure Laterality Date  . APPENDECTOMY    . BACK SURGERY  09/14/2002   L5-S1 hemilaminectomy and diskectomy   . BACK SURGERY  09/12/2001   L5-S1 microdiskectomy   . COLONOSCOPY N/A 11/02/2014   Procedure: COLONOSCOPY;  Surgeon: Robert Bellow, MD;  Location: [facility] ENDOSCOPY;  Service: Endoscopy;  Laterality: N/A;  . COLONOSCOPY WITH PROPOFOL N/A 11/16/2016   Procedure: COLONOSCOPY WITH PROPOFOL;  Surgeon: Lollie Sails, MD;  Location: [facility] ENDOSCOPY;  Service: Endoscopy;  Laterality: N/A;  . HAND SURGERY    . TUBAL LIGATION      Family History  Problem Relation Age of Onset  . Osteoporosis Mother   . Heart block Father   . Heart attack Father    . Diabetes Brother        Type 2  . Arthritis Sister   . Breast cancer Neg Hx     Social History   Socioeconomic History  . Marital status: Married    Spouse name: Not on file  . Number of children: Not on file  . Years of education: Not on file  . Highest education level: Not on file  Occupational History  . Not on file  Social Needs  . Financial resource strain: Not on file  . Food insecurity:    Worry: Not on file    Inability: Not on file  . Transportation needs:    Medical: Not on file    Non-medical: Not on file  Tobacco Use  . Smoking status: Light Tobacco Smoker    Packs/day: 0.50    Types: Cigarettes  . Smokeless tobacco: Never Used  Substance and Sexual Activity  . Alcohol use: Yes    Alcohol/week: 2.0 - 3.0 standard drinks    Types: 2 - 3 Standard drinks or equivalent per week    Comment: socially  . Drug use: No  . Sexual activity: Yes    Partners: Male    Birth control/protection: Post-menopausal  Lifestyle  . Physical activity:    Days per week: Not on file    Minutes per session: Not on file  . Stress: Not on  file  Relationships  . Social connections:    Talks on phone: Not on file    Gets together: Not on file    Attends religious service: Not on file    Active member of club or organization: Not on file    Attends meetings of clubs or organizations: Not on file    Relationship status: Not on file  . Intimate partner violence:    Fear of current or ex partner: Not on file    Emotionally abused: Not on file    Physically abused: Not on file    Forced sexual activity: Not on file  Other Topics Concern  . Not on file  Social History Narrative  . Not on file    Current Meds  Medication Sig  . ALPRAZolam (XANAX) 0.25 MG tablet Take 0.25 mg by mouth at bedtime as needed for sleep.  Marland Kitchen atorvastatin (LIPITOR) 40 MG tablet   . B-D ULTRA-FINE 33 LANCETS MISC Use. Use 1 lancet to skin twice daily as directed  . Blood Glucose Monitoring Suppl  (FIFTY50 GLUCOSE METER 2.0) w/Device KIT Use as directed ONE TOUCH ULTRA MINI METER KIT E11.65  . Continuous Blood Gluc Sensor (FREESTYLE LIBRE SENSOR SYSTEM) MISC Use 3 each every 10 (ten) days.  . Dulaglutide (TRULICITY) 1.5 CW/2.3JS SOPN Trulicity 1.5 EG/3.1 mL subcutaneous pen injector  . DULoxetine (CYMBALTA) 30 MG capsule   . gabapentin (NEURONTIN) 300 MG capsule gabapentin 300 mg capsule  . glucose blood (ONE TOUCH ULTRA TEST) test strip Use 2 (two) times daily. Use as instructed.  . metFORMIN (GLUCOPHAGE-XR) 500 MG 24 hr tablet   . Multiple Vitamin (MULTI-VITAMINS) TABS Take by mouth.      ROS:  Review of Systems  Constitutional: Negative for fatigue, fever and unexpected weight change.  Respiratory: Negative for cough, shortness of breath and wheezing.   Cardiovascular: Negative for chest pain, palpitations and leg swelling.  Gastrointestinal: Negative for blood in stool, constipation, diarrhea, nausea and vomiting.  Endocrine: Negative for cold intolerance, heat intolerance and polyuria.  Genitourinary: Negative for dyspareunia, dysuria, flank pain, frequency, genital sores, hematuria, menstrual problem, pelvic pain, urgency, vaginal bleeding, vaginal discharge and vaginal pain.  Musculoskeletal: Negative for back pain, joint swelling and myalgias.  Skin: Negative for rash.  Neurological: Negative for dizziness, syncope, light-headedness, numbness and headaches.  Hematological: Negative for adenopathy.  Psychiatric/Behavioral: Negative for agitation, confusion, sleep disturbance and suicidal ideas. The patient is not nervous/anxious.      Objective: BP 126/82   Pulse 69   Ht '5\' 1"'$  (1.549 m)   Wt 190 lb (86.2 kg)   BMI 35.90 kg/m    Physical Exam  Constitutional: She is oriented to person, place, and time. She appears well-developed and well-nourished.  Genitourinary: Vagina normal and uterus normal. There is no rash or tenderness on the right labia. There is no rash  or tenderness on the left labia. No erythema or tenderness in the vagina. No vaginal discharge found. Right adnexum does not display mass and does not display tenderness. Left adnexum does not display mass and does not display tenderness. Cervix does not exhibit motion tenderness or polyp. Uterus is not enlarged or tender.  Neck: Normal range of motion. No thyromegaly present.  Cardiovascular: Normal rate, regular rhythm and normal heart sounds.  No murmur heard. Pulmonary/Chest: Effort normal and breath sounds normal. Right breast exhibits no mass, no nipple discharge, no skin change and no tenderness. Left breast exhibits no mass, no nipple discharge, no skin  change and no tenderness.  Abdominal: Soft. There is no tenderness. There is no guarding.  Musculoskeletal: Normal range of motion.  Neurological: She is alert and oriented to person, place, and time. No cranial nerve deficit.  Psychiatric: She has a normal mood and affect. Her behavior is normal.  Vitals reviewed.   Assessment/Plan:  Encounter for annual routine gynecological examination  Cervical cancer screening - Plan: Cytology - PAP  Screening for HPV (human papillomavirus) - Plan: Cytology - PAP  Screening for breast cancer - Plan: MM 3D SCREEN BREAST BILATERAL         GYN counsel mammography screening, menopause, adequate intake of calcium and vitamin D, diet and exercise    F/U  Return in about 1 year (around 02/23/2019).  Rebekka Lobello B. Keyondra Lagrand, PA-C 02/22/2018 8:50 AM

## 2018-02-22 NOTE — Patient Instructions (Signed)
I value your feedback and entrusting us with your care. If you get a [facility] patient survey, I would appreciate you taking the time to let us know about your experience today. Thank you! 

## 2018-02-24 LAB — CYTOLOGY - PAP
Diagnosis: NEGATIVE
HPV (WINDOPATH): NOT DETECTED

## 2018-09-21 DIAGNOSIS — Z85828 Personal history of other malignant neoplasm of skin: Secondary | ICD-10-CM | POA: Insufficient documentation

## 2018-11-09 ENCOUNTER — Other Ambulatory Visit: Payer: Self-pay | Admitting: Nurse Practitioner

## 2018-11-09 DIAGNOSIS — Z1231 Encounter for screening mammogram for malignant neoplasm of breast: Secondary | ICD-10-CM

## 2018-12-20 ENCOUNTER — Inpatient Hospital Stay: Admission: RE | Admit: 2018-12-20 | Payer: Managed Care, Other (non HMO) | Source: Ambulatory Visit

## 2019-02-07 ENCOUNTER — Other Ambulatory Visit: Payer: Self-pay

## 2019-02-07 DIAGNOSIS — Z20822 Contact with and (suspected) exposure to covid-19: Secondary | ICD-10-CM

## 2019-02-09 LAB — NOVEL CORONAVIRUS, NAA: SARS-CoV-2, NAA: NOT DETECTED

## 2019-02-21 ENCOUNTER — Ambulatory Visit
Admission: RE | Admit: 2019-02-21 | Discharge: 2019-02-21 | Disposition: A | Discharge status: Home / Self Care | Payer: Managed Care, Other (non HMO) | Source: Ambulatory Visit | Attending: Nurse Practitioner | Admitting: Nurse Practitioner

## 2019-02-21 DIAGNOSIS — Z1231 Encounter for screening mammogram for malignant neoplasm of breast: Secondary | ICD-10-CM | POA: Diagnosis present

## 2019-07-03 IMAGING — MG MM DIGITAL SCREENING BILAT W/ CAD
4 series · 4 of 4 positions shown · non-contrast
Comparison: Previous exam(s).

CLINICAL DATA: Screening.

EXAM:
DIGITAL SCREENING BILATERAL MAMMOGRAM WITH CAD

[R MLO]
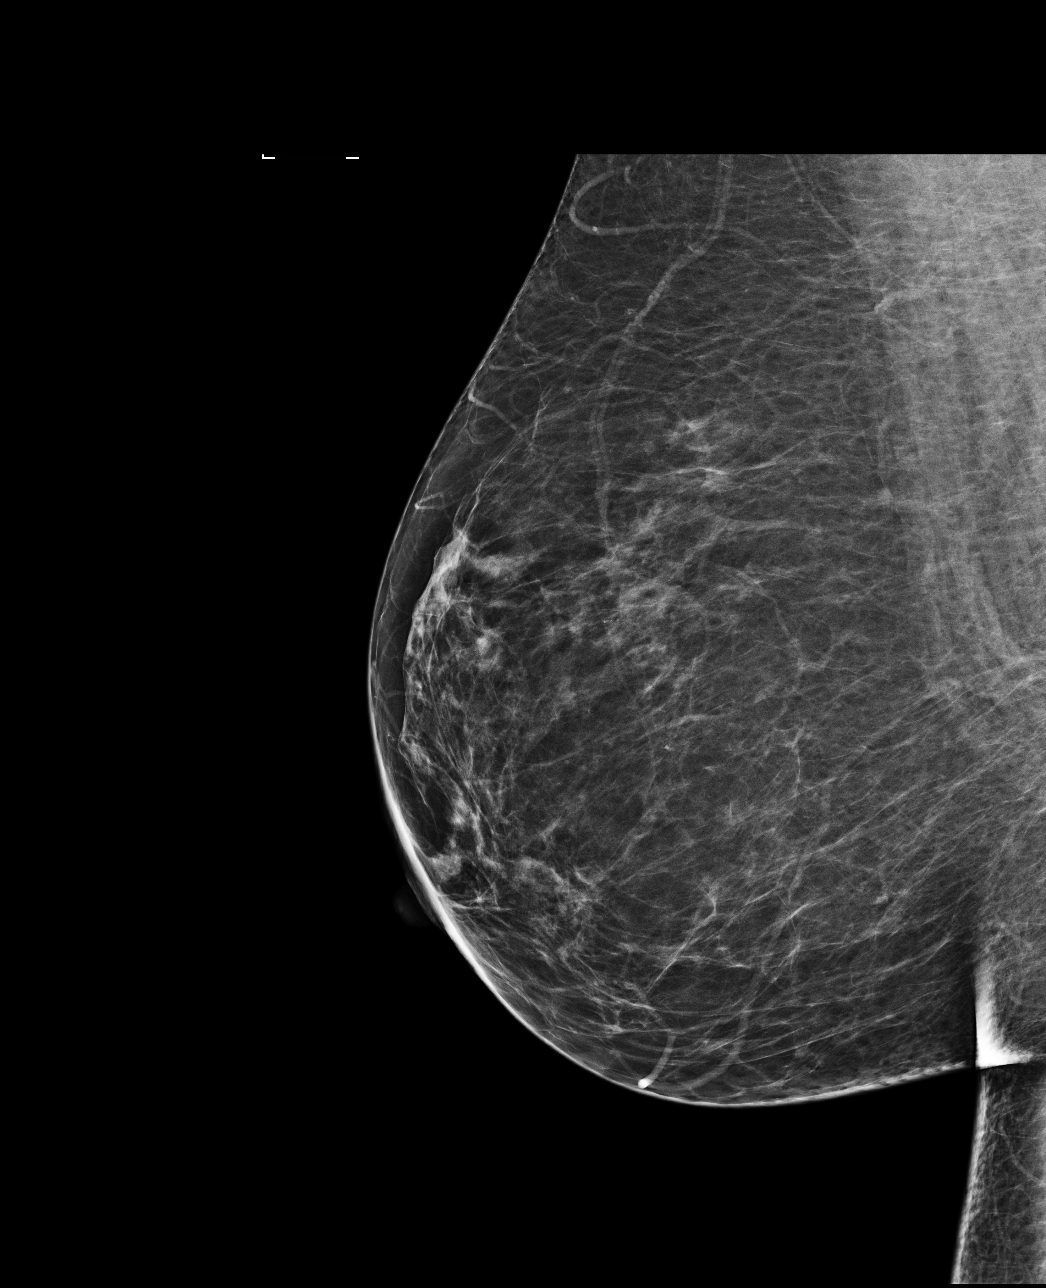

[R CC]
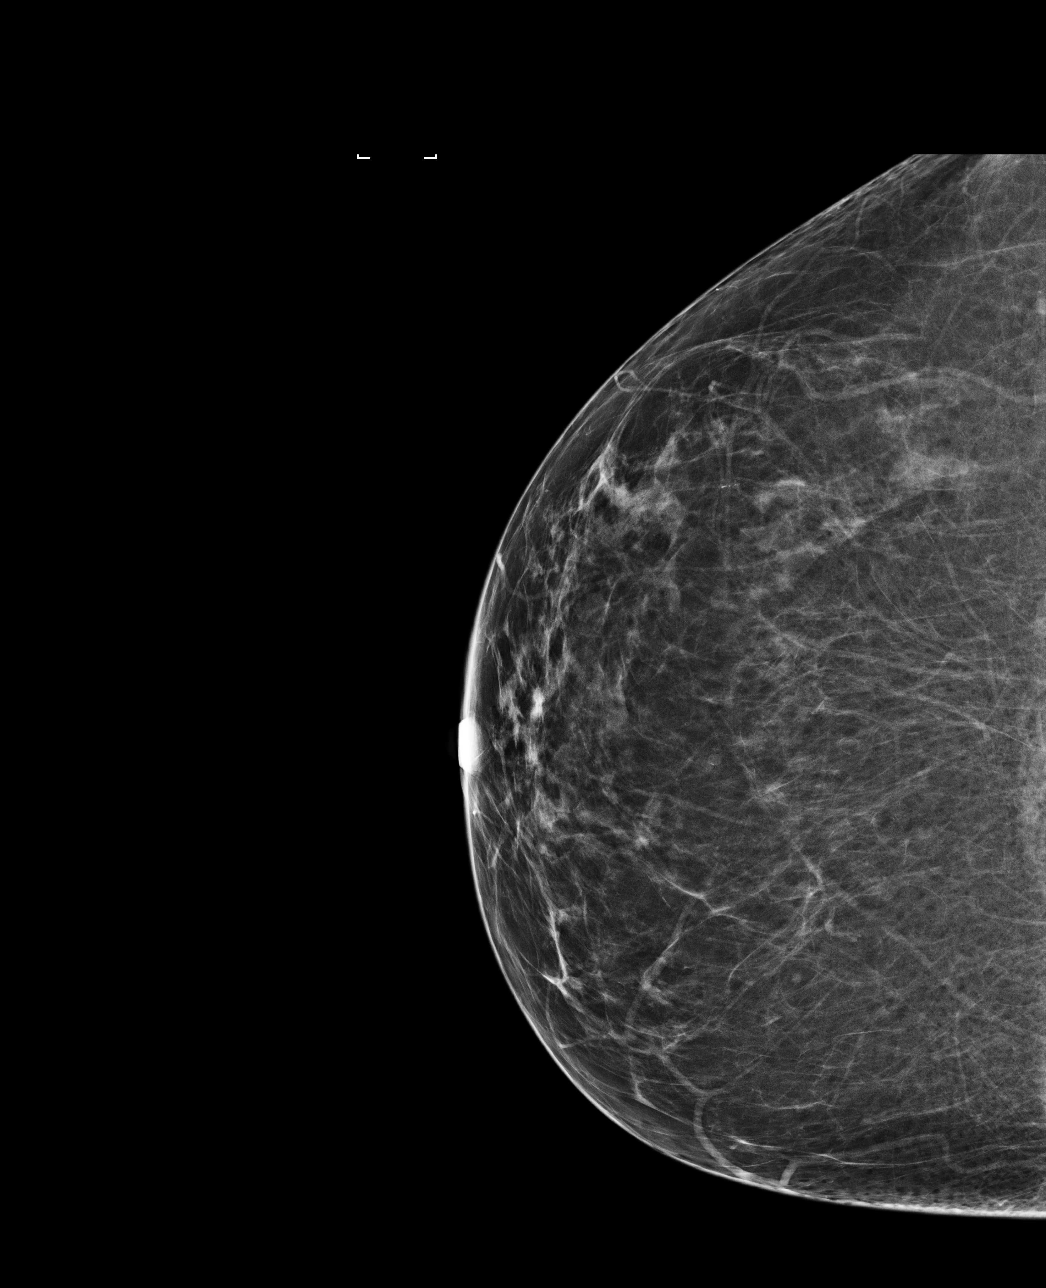

[L CC]
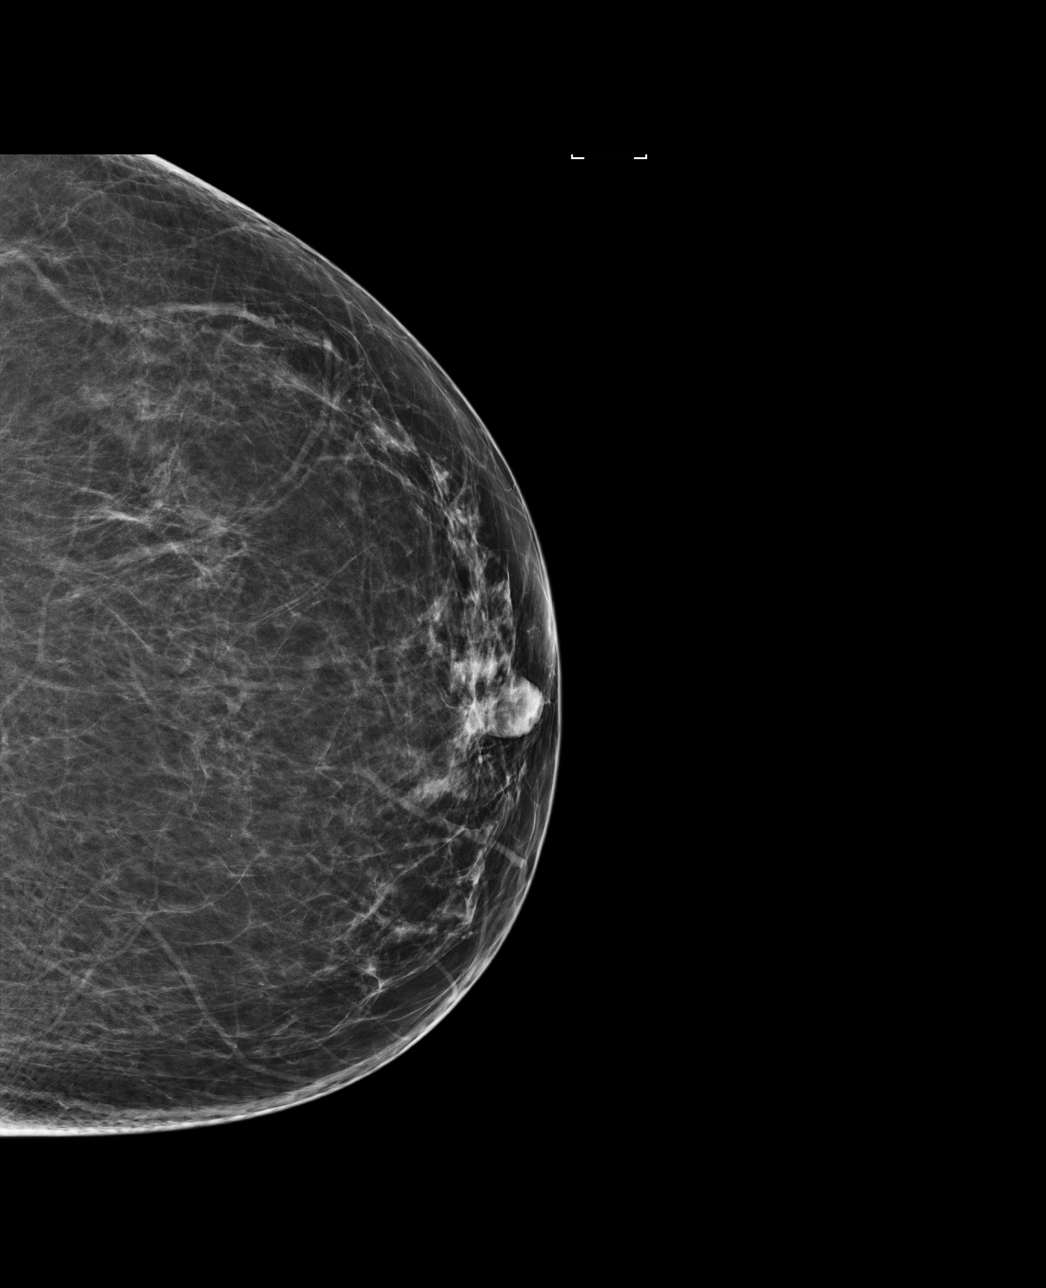

[L MLO]
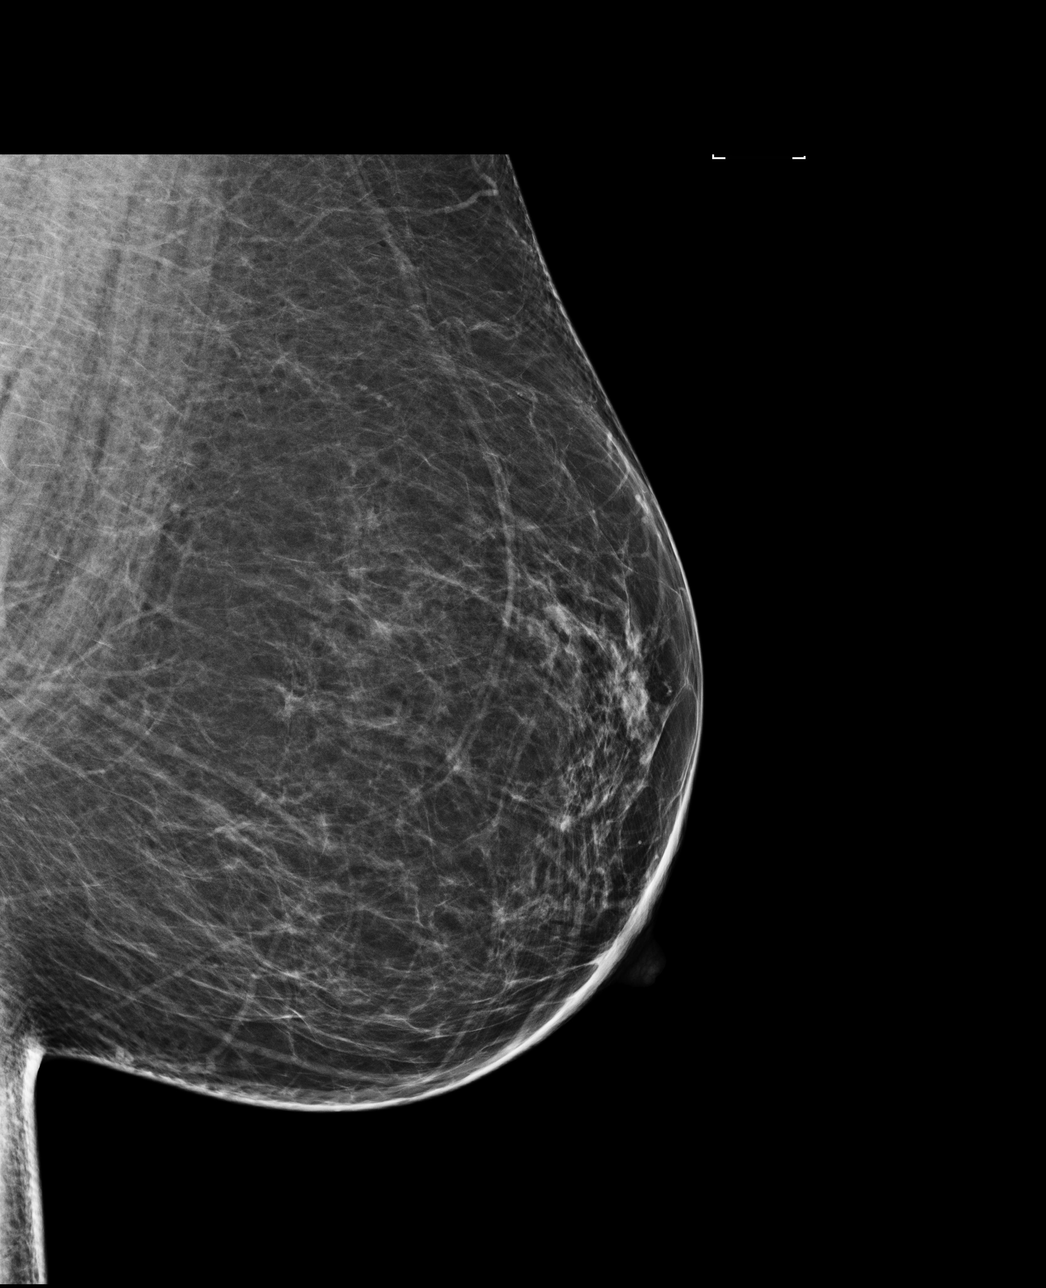

[4 of 4 positions shown; findings below may reference images not displayed]

ACR Breast Density Category b: There are scattered areas of
fibroglandular density.
FINDINGS: There are no findings suspicious for malignancy. Images were
processed with CAD.
IMPRESSION: No mammographic evidence of malignancy. A result letter of this
screening mammogram will be mailed directly to the patient.

RECOMMENDATION:
Screening mammogram in one year. (Code:AS-G-LCT)

BI-RADS CATEGORY  1: Negative.

## 2019-11-21 DIAGNOSIS — M720 Palmar fascial fibromatosis [Dupuytren]: Secondary | ICD-10-CM | POA: Insufficient documentation

## 2020-01-31 ENCOUNTER — Other Ambulatory Visit: Payer: Self-pay

## 2020-04-17 ENCOUNTER — Other Ambulatory Visit: Payer: Self-pay | Admitting: Nurse Practitioner

## 2020-04-17 DIAGNOSIS — Z1231 Encounter for screening mammogram for malignant neoplasm of breast: Secondary | ICD-10-CM

## 2020-06-10 ENCOUNTER — Encounter: Payer: Self-pay | Admitting: Advanced Practice Midwife

## 2020-06-10 ENCOUNTER — Ambulatory Visit (INDEPENDENT_AMBULATORY_CARE_PROVIDER_SITE_OTHER): Payer: Managed Care, Other (non HMO) | Admitting: Advanced Practice Midwife

## 2020-06-10 ENCOUNTER — Other Ambulatory Visit: Payer: Self-pay

## 2020-06-10 ENCOUNTER — Ambulatory Visit
Admission: RE | Admit: 2020-06-10 | Discharge: 2020-06-10 | Disposition: A | Discharge status: Home / Self Care | Payer: Managed Care, Other (non HMO) | Source: Ambulatory Visit | Attending: Nurse Practitioner | Admitting: Nurse Practitioner

## 2020-06-10 VITALS — BP 126/84 | Ht 61.0 in | Wt 188.0 lb

## 2020-06-10 DIAGNOSIS — Z01419 Encounter for gynecological examination (general) (routine) without abnormal findings: Secondary | ICD-10-CM

## 2020-06-10 DIAGNOSIS — Z1231 Encounter for screening mammogram for malignant neoplasm of breast: Secondary | ICD-10-CM | POA: Diagnosis not present

## 2020-06-10 DIAGNOSIS — Z124 Encounter for screening for malignant neoplasm of cervix: Secondary | ICD-10-CM

## 2020-06-10 DIAGNOSIS — E785 Hyperlipidemia, unspecified: Secondary | ICD-10-CM | POA: Insufficient documentation

## 2020-06-10 DIAGNOSIS — E669 Obesity, unspecified: Secondary | ICD-10-CM | POA: Insufficient documentation

## 2020-06-10 DIAGNOSIS — F418 Other specified anxiety disorders: Secondary | ICD-10-CM | POA: Insufficient documentation

## 2020-06-10 DIAGNOSIS — E119 Type 2 diabetes mellitus without complications: Secondary | ICD-10-CM | POA: Insufficient documentation

## 2020-06-10 NOTE — Progress Notes (Signed)
Gynecology Annual Exam  PCP: Sallee Lange, NP  Chief Complaint:  Chief Complaint  Patient presents with  . Annual Exam    History of Present Illness:Patient is a 55 y.o. G1P0 presents for annual exam. The patient has no gyn complaints today. Her mammogram is scheduled today in [place].   LMP: No LMP recorded. Patient is postmenopausal.  Postcoital Bleeding: no  The patient is sexually active. She denies dyspareunia.  The patient does perform self breast exams.  There is no notable family history of breast or ovarian cancer in her family.  The patient wears seatbelts: yes.   The patient has regular exercise: she is active outside and gardening/yard work, she eats on vegetables or fruits, she does drink water and she gets 7-8 hours of sleep. She is working with her PCP on controlling blood sugar. She smokes 1/2 ppd and is not ready to quit.    The patient denies current symptoms of depression. Her medications are at the correct dose per her report.    Review of Systems: Review of Systems  Constitutional: Negative for chills and fever.  HENT: Negative for congestion, ear discharge, ear pain, hearing loss, sinus pain and sore throat.   Eyes: Negative for blurred vision and double vision.  Respiratory: Negative for cough, shortness of breath and wheezing.   Cardiovascular: Negative for chest pain, palpitations and leg swelling.  Gastrointestinal: Negative for abdominal pain, blood in stool, constipation, diarrhea, heartburn, melena, nausea and vomiting.  Genitourinary: Negative for dysuria, flank pain, frequency, hematuria and urgency.  Musculoskeletal: Negative for back pain, joint pain and myalgias.  Skin: Negative for itching and rash.  Neurological: Negative for dizziness, tingling, tremors, sensory change, speech change, focal weakness, seizures, loss of consciousness, weakness and headaches.  Endo/Heme/Allergies: Negative for environmental allergies. Does not  bruise/bleed easily.  Psychiatric/Behavioral: Negative for depression, hallucinations, memory loss, substance abuse and suicidal ideas. The patient is not nervous/anxious and does not have insomnia.     Past Medical History:  Patient Active Problem List   Diagnosis Date Noted  . Obesity (BMI 30-39.9) 06/10/2020  . Situational anxiety 06/10/2020    Formatting of this note might be different from the original. At work.  Needs rare xanax to manage this.   . Diabetes mellitus, type 2 ([place]) 06/10/2020    Formatting of this note might be different from the original. Followed by Dr. Eddie Dibbles   . Hyperlipidemia 06/10/2020  . Dupuytren's disease of palm of left hand 11/21/2019  . Dupuytren's disease of palm of right hand 11/21/2019  . Personal history of other malignant neoplasm of skin 09/21/2018  . Encounter for screening colonoscopy 08/22/2014    Past Surgical History:  Past Surgical History:  Procedure Laterality Date  . APPENDECTOMY    . BACK SURGERY  09/14/2002   L5-S1 hemilaminectomy and diskectomy   . BACK SURGERY  09/12/2001   L5-S1 microdiskectomy   . COLONOSCOPY N/A 11/02/2014   Procedure: COLONOSCOPY;  Surgeon: Robert Bellow, MD;  Location: [facility] ENDOSCOPY;  Service: Endoscopy;  Laterality: N/A;  . COLONOSCOPY WITH PROPOFOL N/A 11/16/2016   Procedure: COLONOSCOPY WITH PROPOFOL;  Surgeon: Lollie Sails, MD;  Location: [facility] ENDOSCOPY;  Service: Endoscopy;  Laterality: N/A;  . HAND SURGERY    . TUBAL LIGATION      Gynecologic History:  No LMP recorded. Patient is postmenopausal. Last Pap: 2 years ago Results were:  no abnormalities  Last mammogram: 2020 Results were: BI-RAD I  Obstetric History:  G1P0  Family History:  Family History  Problem Relation Age of Onset  . Osteoporosis Mother   . Heart block Father   . Heart attack Father   . Diabetes Brother        Type 2  . Arthritis Sister   . Breast cancer Neg Hx     Social History:  Social History    Socioeconomic History  . Marital status: Married    Spouse name: Not on file  . Number of children: Not on file  . Years of education: Not on file  . Highest education level: Not on file  Occupational History  . Not on file  Tobacco Use  . Smoking status: Light Tobacco Smoker    Packs/day: 0.50    Types: Cigarettes  . Smokeless tobacco: Never Used  Vaping Use  . Vaping Use: Never used  Substance and Sexual Activity  . Alcohol use: Yes    Alcohol/week: 2.0 - 3.0 standard drinks    Types: 2 - 3 Standard drinks or equivalent per week    Comment: socially  . Drug use: No  . Sexual activity: Yes    Partners: Male    Birth control/protection: Post-menopausal  Other Topics Concern  . Not on file  Social History Narrative  . Not on file   Social Determinants of Health   Financial Resource Strain: Not on file  Food Insecurity: Not on file  Transportation Needs: Not on file  Physical Activity: Not on file  Stress: Not on file  Social Connections: Not on file  Intimate Partner Violence: Not on file    Allergies:  No Known Allergies  Medications: Prior to Admission medications   Medication Sig Start Date End Date Taking? Authorizing Provider  ALPRAZolam Duanne Moron) 0.25 MG tablet Take 0.25 mg by mouth at bedtime as needed for sleep.   Yes [provider]  atorvastatin (LIPITOR) 40 MG tablet  08/13/14  Yes [provider]  B-D ULTRA-FINE 33 LANCETS MISC Use. Use 1 lancet to skin twice daily as directed   Yes [provider]  Blood Glucose Monitoring Suppl (FIFTY50 GLUCOSE METER 2.0) w/Device KIT Use as directed ONE TOUCH ULTRA MINI METER KIT E11.65 02/22/17  Yes [provider]  Continuous Blood Gluc Sensor ([place]) MISC Use 3 each every 10 (ten) days. 11/05/16  Yes [provider]  Dulaglutide 1.5 DG/3.8VF SOPN Trulicity 1.5 IE/3.3 mL subcutaneous pen injector 12/07/17  Yes [provider]  DULoxetine  (CYMBALTA) 30 MG capsule  08/11/14  Yes [provider]  gabapentin (NEURONTIN) 300 MG capsule gabapentin 300 mg capsule   Yes [provider]  glucose blood test strip Use 2 (two) times daily. Use as instructed.   Yes [provider]  metFORMIN (GLUCOPHAGE-XR) 500 MG 24 hr tablet  06/19/14  Yes [provider]  Multiple Vitamin (MULTI-VITAMINS) TABS Take by mouth.   Yes [provider]    Physical Exam Vitals: Blood pressure 126/84, height 5' 1" (1.549 m), weight 188 lb (85.3 kg).  General: NAD HEENT: normocephalic, anicteric Thyroid: no enlargement, no palpable nodules Pulmonary: No increased work of breathing, CTAB Cardiovascular: RRR, distal pulses 2+ Breast: Breast symmetrical, no tenderness, no palpable nodules or masses, no skin or nipple retraction present, no nipple discharge.  No axillary or supraclavicular lymphadenopathy. Abdomen: NABS, soft, non-tender, non-distended.  Umbilicus without lesions.  No hepatomegaly, splenomegaly or masses palpable. No evidence of hernia  Genitourinary:  External: Normal external female genitalia.  Normal urethral meatus, normal Bartholin's and Skene's glands.    Vagina: Normal vaginal mucosa, no evidence of prolapse.    Cervix: Grossly normal in appearance, no bleeding, no CMT  Uterus: Non-enlarged, mobile, normal contour.    Adnexa: ovaries non-enlarged, no adnexal masses  Rectal: deferred  Lymphatic: no evidence of inguinal lymphadenopathy Extremities: no edema, erythema, or tenderness Neurologic: Grossly intact Psychiatric: mood appropriate, affect full   Assessment: 55 y.o. G1P0 routine annual exam  Plan: Problem List Items Addressed This Visit   None   Visit Diagnoses    Well woman exam with routine gynecological exam    -  Primary   Relevant Orders   Cytology - PAP   Screening for cervical cancer       Relevant Orders   Cytology - PAP      1) Mammogram - recommend yearly  screening mammogram.  Mammogram Is up to date  2) STI screening  was offered and declined  3) ASCCP guidelines and rationale discussed.  Patient opts for every 2-3 years screening interval  4) Osteoporosis  - per USPTF routine screening DEXA at age 55  Consider FDA-approved medical therapies in postmenopausal women and men aged 54 years and older, based on the following: a) A hip or vertebral (clinical or morphometric) fracture b) T-score ? -2.5 at the femoral neck or spine after appropriate evaluation to exclude secondary causes C) Low bone mass (T-score between -1.0 and -2.5 at the femoral neck or spine) and a 10-year probability of a hip fracture ? 3% or a 10-year probability of a major osteoporosis-related fracture ? 20% based on the [place]-adapted WHO algorithm   5) Routine healthcare maintenance including cholesterol, diabetes screening discussed managed by PCP  6) Colonoscopy is next due in 2028.  Screening recommended starting at age 66 for average risk individuals, age 4 for individuals deemed at increased risk (including African Americans) and recommended to continue until age 13.  For patient age 26-85 individualized approach is recommended.  Gold standard screening is via colonoscopy, Cologuard screening is an acceptable alternative for patient unwilling or unable to undergo colonoscopy.  "Colorectal cancer screening for average?risk adults: 2018 guideline update from the American Cancer Society"CA: A Cancer Journal for Clinicians: Aug 19, 2016   7) Return in about 1 year (around 06/10/2021) for annual established gyn.    Christean Leaf, CNM [facility] 06/10/20, 2:53 PM

## 2020-06-13 LAB — IGP, APTIMA HPV: HPV Aptima: NEGATIVE

## 2021-02-05 ENCOUNTER — Other Ambulatory Visit: Payer: Self-pay

## 2021-04-03 ENCOUNTER — Other Ambulatory Visit: Payer: Self-pay | Admitting: Nurse Practitioner

## 2021-04-03 DIAGNOSIS — Z1231 Encounter for screening mammogram for malignant neoplasm of breast: Secondary | ICD-10-CM

## 2021-09-30 ENCOUNTER — Ambulatory Visit: Payer: Managed Care, Other (non HMO)

## 2021-10-08 ENCOUNTER — Ambulatory Visit
Admission: RE | Admit: 2021-10-08 | Discharge: 2021-10-08 | Disposition: A | Discharge status: Home / Self Care | Payer: Managed Care, Other (non HMO) | Source: Ambulatory Visit | Attending: Nurse Practitioner | Admitting: Nurse Practitioner

## 2021-10-08 DIAGNOSIS — Z1231 Encounter for screening mammogram for malignant neoplasm of breast: Secondary | ICD-10-CM | POA: Diagnosis present

## 2022-06-23 ENCOUNTER — Other Ambulatory Visit: Payer: Self-pay | Admitting: Nurse Practitioner

## 2022-06-23 DIAGNOSIS — Z1231 Encounter for screening mammogram for malignant neoplasm of breast: Secondary | ICD-10-CM

## 2022-11-18 ENCOUNTER — Ambulatory Visit
Admission: RE | Admit: 2022-11-18 | Discharge: 2022-11-18 | Disposition: A | Discharge status: Home / Self Care | Payer: Managed Care, Other (non HMO) | Source: Ambulatory Visit | Attending: Nurse Practitioner | Admitting: Nurse Practitioner

## 2022-11-18 DIAGNOSIS — Z1231 Encounter for screening mammogram for malignant neoplasm of breast: Secondary | ICD-10-CM | POA: Insufficient documentation

## 2023-09-30 ENCOUNTER — Other Ambulatory Visit: Payer: Self-pay | Admitting: Nurse Practitioner

## 2023-09-30 DIAGNOSIS — Z1231 Encounter for screening mammogram for malignant neoplasm of breast: Secondary | ICD-10-CM

## 2023-11-23 ENCOUNTER — Ambulatory Visit
Admission: RE | Admit: 2023-11-23 | Discharge: 2023-11-23 | Disposition: A | Discharge status: Home / Self Care | Source: Ambulatory Visit | Attending: Nurse Practitioner | Admitting: Nurse Practitioner

## 2023-11-23 DIAGNOSIS — Z1231 Encounter for screening mammogram for malignant neoplasm of breast: Secondary | ICD-10-CM | POA: Diagnosis present

## 2024-04-13 ENCOUNTER — Other Ambulatory Visit: Payer: Self-pay

## 2024-04-13 ENCOUNTER — Ambulatory Visit: Admitting: Anesthesiology

## 2024-04-13 ENCOUNTER — Encounter: Admission: RE | Disposition: A | Payer: Self-pay | Source: Home / Self Care | Attending: Gastroenterology

## 2024-04-13 ENCOUNTER — Ambulatory Visit
Admission: RE | Admit: 2024-04-13 | Discharge: 2024-04-13 | Disposition: A | Discharge status: Home / Self Care | Payer: Self-pay | Attending: Gastroenterology | Admitting: Gastroenterology

## 2024-04-13 ENCOUNTER — Encounter: Payer: Self-pay | Admitting: Gastroenterology

## 2024-04-13 DIAGNOSIS — Z1211 Encounter for screening for malignant neoplasm of colon: Secondary | ICD-10-CM | POA: Insufficient documentation

## 2024-04-13 DIAGNOSIS — Z538 Procedure and treatment not carried out for other reasons: Secondary | ICD-10-CM | POA: Diagnosis not present

## 2024-04-13 DIAGNOSIS — Z860101 Personal history of adenomatous and serrated colon polyps: Secondary | ICD-10-CM | POA: Diagnosis present

## 2024-04-13 HISTORY — PX: COLONOSCOPY: SHX5424

## 2024-04-13 LAB — GLUCOSE, CAPILLARY: Glucose-Capillary: 89 mg/dL (ref 70–99)

## 2024-04-13 MED ORDER — LIDOCAINE HCL (PF) 2 % IJ SOLN
INTRAMUSCULAR | Status: AC
  Filled 2024-04-13: qty 20

## 2024-04-13 MED ORDER — EPHEDRINE 5 MG/ML INJ
INTRAVENOUS | Status: AC
  Filled 2024-04-13: qty 5

## 2024-04-13 MED ORDER — SODIUM CHLORIDE 0.9 % IV SOLN: INTRAVENOUS | Status: DC

## 2024-04-13 NOTE — OR Nursing (Signed)
 Pt not cleaned out with golytely  jug. Suggested to do another prep and r/s appt. In the near future, pt in agreement. Office notified by Dr. Onita

## 2024-04-13 NOTE — H&P (Signed)
 Brief GI Note  Pt still with brown bowel movements. Will reschedule.  Elspeth EMERSON Jungling, DO [facility]

## 2024-04-13 NOTE — Anesthesia Preprocedure Evaluation (Signed)
 "                                  Anesthesia Evaluation  Patient identified by MRN, date of birth, ID band Patient awake    Reviewed: Allergy & Precautions, NPO status , Patient's Chart, lab work & pertinent test results  History of Anesthesia Complications Negative for: history of anesthetic complications  Airway Mallampati: III  TM Distance: >3 FB Neck ROM: full    Dental no notable dental hx.    Pulmonary neg pulmonary ROS, Current Smoker and Patient abstained from smoking.   Pulmonary exam normal        Cardiovascular negative cardio ROS Normal cardiovascular exam     Neuro/Psych  PSYCHIATRIC DISORDERS Anxiety     negative neurological ROS     GI/Hepatic negative GI ROS, Neg liver ROS,,,  Endo/Other  negative endocrine ROSdiabetes, Type 2    Renal/GU negative Renal ROS  negative genitourinary   Musculoskeletal   Abdominal   Peds  Hematology negative hematology ROS (+)   Anesthesia Other Findings Past Medical History: 2009: Diabetes mellitus without complication (HCC)     Comment:  Type 2 No date: Dyslipidemia No date: Insomnia No date: Obesity No date: Osteoarthrosis     Comment:  unspecified whether generalized or localized, lower leg   Past Surgical History: No date: APPENDECTOMY 09/14/2002: BACK SURGERY     Comment:  L5-S1 hemilaminectomy and diskectomy  09/12/2001: BACK SURGERY     Comment:  L5-S1 microdiskectomy  11/02/2014: COLONOSCOPY; N/A     Comment:  Procedure: COLONOSCOPY;  Surgeon: Reyes LELON Cota, MD;              Location: [facility] ENDOSCOPY;  Service: Endoscopy;                Laterality: N/A; 11/16/2016: COLONOSCOPY WITH PROPOFOL ; N/A     Comment:  Procedure: COLONOSCOPY WITH PROPOFOL ;  Surgeon:               Gaylyn Gladis PENNER, MD;  Location[facility] ENDOSCOPY;                Service: Endoscopy;  Laterality: N/A; No date: HAND SURGERY No date: TUBAL LIGATION  BMI    Body Mass Index: 28.35 kg/m       Reproductive/Obstetrics negative OB ROS                              Anesthesia Physical Anesthesia Plan  ASA: 2  Anesthesia Plan: General   Post-op Pain Management: Minimal or no pain anticipated   Induction: Intravenous  PONV Risk Score and Plan: 2 and Propofol  infusion and TIVA  Airway Management Planned: Natural Airway and Nasal Cannula  Additional Equipment:   Intra-op Plan:   Post-operative Plan:   Informed Consent: I have reviewed the patients History and Physical, chart, labs and discussed the procedure including the risks, benefits and alternatives for the proposed anesthesia with the patient or authorized representative who has indicated his/her understanding and acceptance.     Dental Advisory Given  Plan Discussed with: Anesthesiologist, CRNA and Surgeon  Anesthesia Plan Comments: (Patient consented for risks of anesthesia including but not limited to:  - adverse reactions to medications - risk of airway placement if required - damage to eyes, teeth, lips or other oral mucosa - nerve damage due to positioning  - sore  throat or hoarseness - Damage to heart, brain, nerves, lungs, other parts of body or loss of life  Patient voiced understanding and assent.)        Anesthesia Quick Evaluation  "
